# Patient Record
Sex: Male | Born: 1990 | Hispanic: Yes | Marital: Single | Smoking: Never smoker
Health system: Southern US, Community
[De-identification: ages and names within clinical notes are randomized; demographics above are authoritative.]

---

## 2013-09-20 ENCOUNTER — Ambulatory Visit: Payer: BC Managed Care – PPO

## 2013-09-20 ENCOUNTER — Ambulatory Visit: Payer: BC Managed Care – PPO | Admitting: Emergency Medicine

## 2013-09-20 ENCOUNTER — Ambulatory Visit (HOSPITAL_COMMUNITY)
Admission: RE | Admit: 2013-09-20 | Discharge: 2013-09-20 | Disposition: A | Discharge status: Home / Self Care | Payer: BC Managed Care – PPO | Source: Ambulatory Visit | Attending: Emergency Medicine | Admitting: Emergency Medicine

## 2013-09-20 VITALS — BP 120/70 | HR 71 | Temp 98.2°F | Resp 16 | Ht 66.5 in | Wt 169.0 lb

## 2013-09-20 DIAGNOSIS — S139XXA Sprain of joints and ligaments of unspecified parts of neck, initial encounter: Secondary | ICD-10-CM

## 2013-09-20 DIAGNOSIS — R410 Disorientation, unspecified: Secondary | ICD-10-CM

## 2013-09-20 DIAGNOSIS — F29 Unspecified psychosis not due to a substance or known physiological condition: Secondary | ICD-10-CM | POA: Insufficient documentation

## 2013-09-20 MED ORDER — NAPROXEN SODIUM 550 MG PO TABS: 550.0000 mg | ORAL_TABLET | Freq: Two times a day (BID) | ORAL | Status: AC

## 2013-09-20 MED ORDER — CYCLOBENZAPRINE HCL 10 MG PO TABS: 10.0000 mg | ORAL_TABLET | Freq: Three times a day (TID) | ORAL | Status: AC | PRN

## 2013-09-20 MED ORDER — ACETAMINOPHEN-CODEINE #3 300-30 MG PO TABS: 1.0000 | ORAL_TABLET | ORAL | Status: AC | PRN

## 2013-09-20 NOTE — Patient Instructions (Signed)
Cervical Sprain A cervical sprain is an injury in the neck in which the strong, fibrous tissues (ligaments) that connect your neck bones stretch or tear. Cervical sprains can range from mild to severe. Severe cervical sprains can cause the neck vertebrae to be unstable. This can lead to damage of the spinal cord and can result in serious nervous system problems. The amount of time it takes for a cervical sprain to get better depends on the cause and extent of the injury. Most cervical sprains heal in 1 to 3 weeks. CAUSES  Severe cervical sprains may be caused by:   Contact sport injuries (such as from football, rugby, wrestling, hockey, auto racing, gymnastics, diving, martial arts, or boxing).   Motor vehicle collisions.   Whiplash injuries. This is an injury from a sudden forward-and backward whipping movement of the head and neck.  Falls.  Mild cervical sprains may be caused by:   Being in an awkward position, such as while cradling a telephone between your ear and shoulder.   Sitting in a chair that does not offer proper support.   Working at a poorly designed computer station.   Looking up or down for long periods of time.  SYMPTOMS   Pain, soreness, stiffness, or a burning sensation in the front, back, or sides of the neck. This discomfort may develop immediately after the injury or slowly, 24 hours or more after the injury.   Pain or tenderness directly in the middle of the back of the neck.   Shoulder or upper back pain.   Limited ability to move the neck.   Headache.   Dizziness.   Weakness, numbness, or tingling in the hands or arms.   Muscle spasms.   Difficulty swallowing or chewing.   Tenderness and swelling of the neck.  DIAGNOSIS  Most of the time your health care provider can diagnose a cervical sprain by taking your history and doing a physical exam. Your health care provider will ask about previous neck injuries and any known neck  problems, such as arthritis in the neck. X-rays may be taken to find out if there are any other problems, such as with the bones of the neck. Other tests, such as a CT scan or MRI, may also be needed.  TREATMENT  Treatment depends on the severity of the cervical sprain. Mild sprains can be treated with rest, keeping the neck in place (immobilization), and pain medicines. Severe cervical sprains are immediately immobilized. Further treatment is done to help with pain, muscle spasms, and other symptoms and may include:  Medicines, such as pain relievers, numbing medicines, or muscle relaxants.   Physical therapy. This may involve stretching exercises, strengthening exercises, and posture training. Exercises and improved posture can help stabilize the neck, strengthen muscles, and help stop symptoms from returning.  HOME CARE INSTRUCTIONS   Put ice on the injured area.   Put ice in a plastic bag.   Place a towel between your skin and the bag.   Leave the ice on for 15 20 minutes, 3 4 times a day.   If your injury was severe, you may have been given a cervical collar to wear. A cervical collar is a two-piece collar designed to keep your neck from moving while it heals.  Do not remove the collar unless instructed by your health care provider.  If you have long hair, keep it outside of the collar.  Ask your health care provider before making any adjustments to your collar.   Minor adjustments may be required over time to improve comfort and reduce pressure on your chin or on the back of your head.  Ifyou are allowed to remove the collar for cleaning or bathing, follow your health care provider's instructions on how to do so safely.  Keep your collar clean by wiping it with mild soap and water and drying it completely. If the collar you have been given includes removable pads, remove them every 1 2 days and hand wash them with soap and water. Allow them to air dry. They should be completely  dry before you wear them in the collar.  If you are allowed to remove the collar for cleaning and bathing, wash and dry the skin of your neck. Check your skin for irritation or sores. If you see any, tell your health care provider.  Do not drive while wearing the collar.   Only take over-the-counter or prescription medicines for pain, discomfort, or fever as directed by your health care provider.   Keep all follow-up appointments as directed by your health care provider.   Keep all physical therapy appointments as directed by your health care provider.   Make any needed adjustments to your workstation to promote good posture.   Avoid positions and activities that make your symptoms worse.   Warm up and stretch before being active to help prevent problems.  SEEK MEDICAL CARE IF:   Your pain is not controlled with medicine.   You are unable to decrease your pain medicine over time as planned.   Your activity level is not improving as expected.  SEEK IMMEDIATE MEDICAL CARE IF:   You develop any bleeding.  You develop stomach upset.  You have signs of an allergic reaction to your medicine.   Your symptoms get worse.   You develop new, unexplained symptoms.   You have numbness, tingling, weakness, or paralysis in any part of your body.  MAKE SURE YOU:   Understand these instructions.  Will watch your condition.  Will get help right away if you are not doing well or get worse. Document Released: 04/17/2007 Document Revised: 04/10/2013 Document Reviewed: 12/26/2012 Exit[employer] Patient Information 2014 [employer].  
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
## 2013-09-26 ENCOUNTER — Ambulatory Visit: Payer: BC Managed Care – PPO | Admitting: Emergency Medicine

## 2013-09-26 VITALS — BP 122/60 | HR 83 | Temp 97.9°F | Resp 18 | Ht 66.5 in | Wt 175.0 lb

## 2013-09-26 DIAGNOSIS — S139XXA Sprain of joints and ligaments of unspecified parts of neck, initial encounter: Secondary | ICD-10-CM
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
## 2013-09-26 NOTE — Patient Instructions (Signed)
Cervical Sprain A cervical sprain is an injury in the neck in which the strong, fibrous tissues (ligaments) that connect your neck bones stretch or tear. Cervical sprains can range from mild to severe. Severe cervical sprains can cause the neck vertebrae to be unstable. This can lead to damage of the spinal cord and can result in serious nervous system problems. The amount of time it takes for a cervical sprain to get better depends on the cause and extent of the injury. Most cervical sprains heal in 1 to 3 weeks. CAUSES  Severe cervical sprains may be caused by:   Contact sport injuries (such as from football, rugby, wrestling, hockey, auto racing, gymnastics, diving, martial arts, or boxing).   Motor vehicle collisions.   Whiplash injuries. This is an injury from a sudden forward-and backward whipping movement of the head and neck.  Falls.  Mild cervical sprains may be caused by:   Being in an awkward position, such as while cradling a telephone between your ear and shoulder.   Sitting in a chair that does not offer proper support.   Working at a poorly designed computer station.   Looking up or down for long periods of time.  SYMPTOMS   Pain, soreness, stiffness, or a burning sensation in the front, back, or sides of the neck. This discomfort may develop immediately after the injury or slowly, 24 hours or more after the injury.   Pain or tenderness directly in the middle of the back of the neck.   Shoulder or upper back pain.   Limited ability to move the neck.   Headache.   Dizziness.   Weakness, numbness, or tingling in the hands or arms.   Muscle spasms.   Difficulty swallowing or chewing.   Tenderness and swelling of the neck.  DIAGNOSIS  Most of the time your health care provider can diagnose a cervical sprain by taking your history and doing a physical exam. Your health care provider will ask about previous neck injuries and any known neck  problems, such as arthritis in the neck. X-rays may be taken to find out if there are any other problems, such as with the bones of the neck. Other tests, such as a CT scan or MRI, may also be needed.  TREATMENT  Treatment depends on the severity of the cervical sprain. Mild sprains can be treated with rest, keeping the neck in place (immobilization), and pain medicines. Severe cervical sprains are immediately immobilized. Further treatment is done to help with pain, muscle spasms, and other symptoms and may include:  Medicines, such as pain relievers, numbing medicines, or muscle relaxants.   Physical therapy. This may involve stretching exercises, strengthening exercises, and posture training. Exercises and improved posture can help stabilize the neck, strengthen muscles, and help stop symptoms from returning.  HOME CARE INSTRUCTIONS   Put ice on the injured area.   Put ice in a plastic bag.   Place a towel between your skin and the bag.   Leave the ice on for 15 20 minutes, 3 4 times a day.   If your injury was severe, you may have been given a cervical collar to wear. A cervical collar is a two-piece collar designed to keep your neck from moving while it heals.  Do not remove the collar unless instructed by your health care provider.  If you have long hair, keep it outside of the collar.  Ask your health care provider before making any adjustments to your collar.   Minor adjustments may be required over time to improve comfort and reduce pressure on your chin or on the back of your head.  Ifyou are allowed to remove the collar for cleaning or bathing, follow your health care provider's instructions on how to do so safely.  Keep your collar clean by wiping it with mild soap and water and drying it completely. If the collar you have been given includes removable pads, remove them every 1 2 days and hand wash them with soap and water. Allow them to air dry. They should be completely  dry before you wear them in the collar.  If you are allowed to remove the collar for cleaning and bathing, wash and dry the skin of your neck. Check your skin for irritation or sores. If you see any, tell your health care provider.  Do not drive while wearing the collar.   Only take over-the-counter or prescription medicines for pain, discomfort, or fever as directed by your health care provider.   Keep all follow-up appointments as directed by your health care provider.   Keep all physical therapy appointments as directed by your health care provider.   Make any needed adjustments to your workstation to promote good posture.   Avoid positions and activities that make your symptoms worse.   Warm up and stretch before being active to help prevent problems.  SEEK MEDICAL CARE IF:   Your pain is not controlled with medicine.   You are unable to decrease your pain medicine over time as planned.   Your activity level is not improving as expected.  SEEK IMMEDIATE MEDICAL CARE IF:   You develop any bleeding.  You develop stomach upset.  You have signs of an allergic reaction to your medicine.   Your symptoms get worse.   You develop new, unexplained symptoms.   You have numbness, tingling, weakness, or paralysis in any part of your body.  MAKE SURE YOU:   Understand these instructions.  Will watch your condition.  Will get help right away if you are not doing well or get worse. Document Released: 04/17/2007 Document Revised: 04/10/2013 Document Reviewed: 12/26/2012 Exit[employer] Patient Information 2014 [employer].  

## 2017-07-03 ENCOUNTER — Other Ambulatory Visit: Payer: Self-pay

## 2017-07-03 ENCOUNTER — Encounter: Payer: Self-pay | Admitting: Physician Assistant

## 2017-07-03 ENCOUNTER — Ambulatory Visit: Payer: Self-pay | Admitting: Physician Assistant

## 2017-07-03 VITALS — BP 128/82 | HR 90 | Temp 98.1°F | Resp 16 | Ht 66.5 in | Wt 196.0 lb

## 2017-07-03 DIAGNOSIS — R0789 Other chest pain: Secondary | ICD-10-CM

## 2017-07-03 DIAGNOSIS — K219 Gastro-esophageal reflux disease without esophagitis: Secondary | ICD-10-CM

## 2017-07-03 LAB — POCT CBC
Granulocyte percent: 65.6 %G (ref 37–80)
HCT, POC: 46.3 % (ref 43.5–53.7)
Hemoglobin: 15.4 g/dL (ref 14.1–18.1)
LYMPH, POC: 2 (ref 0.6–3.4)
MCH, POC: 29.4 pg (ref 27–31.2)
MCHC: 33.2 g/dL (ref 31.8–35.4)
MCV: 88.4 fL (ref 80–97)
MID (CBC): 0.3 (ref 0–0.9)
MPV: 6.8 fL (ref 0–99.8)
POC Granulocyte: 4.5 (ref 2–6.9)
POC LYMPH PERCENT: 29.6 %L (ref 10–50)
POC MID %: 4.8 %M (ref 0–12)
Platelet Count, POC: 319 10*3/uL (ref 142–424)
RBC: 5.23 M/uL (ref 4.69–6.13)
RDW, POC: 12.3 %
WBC: 6.9 10*3/uL (ref 4.6–10.2)

## 2017-07-03 MED ORDER — OMEPRAZOLE 40 MG PO CPDR: 40.0000 mg | DELAYED_RELEASE_CAPSULE | Freq: Every day | ORAL | 1 refills | Status: AC

## 2017-07-03 NOTE — Progress Notes (Signed)
[facility] [address], [place] [phone]  Date:  07/03/2017   Name:  Jonell CluckJuan Jose Garcia-Salazar   DOB:  [birth year]   MRN:  [id number]  PCP:  Patient, No Pcp Per    History of Present Illness:  Jonell CluckJuan Jose Garcia-Salazar is a 26 y.o. male patient who presents to PCP with  Chief Complaint  Patient presents with  . Shortness of Breath    x 1 month  . Chest Pain    x 1 month, pt states he feels a burning sensation after eating, possible acid reflux     2 months, feels like he can not get enough of oxygen. He will have chest pains 2 days ago, at the center of chest which appeared to pressure.  He tries to inhale very deeply, will have chest pain.  The chest pain is imnmediately after a meal at times.  He was breathing chemical vapors at his work, his breathing was off.  Works at a [employer].   There is no nausea associated, or palpitations,  Dizziness, diaphoresis, or vision changes.   No history of asthma.   Non-smoker.   No family history of heart attack,  He is eating mostly fastfood, sodas 3 per week,  EtOH 5 beers   There are no active problems to display for this patient.  Wt Readings from Last 3 Encounters:  07/03/17 196 lb (88.9 kg)  09/26/13 175 lb (79.4 kg)  09/20/13 169 lb (76.7 kg)     No past medical history on file.  No past surgical history on file.  Social History   Tobacco Use  . Smoking status: Never Smoker  Substance Use Topics  . Alcohol use: Not on file  . Drug use: Not on file    No family history on file.  No Known Allergies  Medication list has been reviewed and updated.  Current Outpatient Medications on File Prior to Visit  Medication Sig Dispense Refill  . acetaminophen-codeine (TYLENOL #3) 300-30 MG per tablet Take 1-2 tablets by mouth every 4 (four) hours as needed. (Patient not taking: Reported on 07/03/2017) 30 tablet 0  . cyclobenzaprine (FLEXERIL) 10 MG tablet Take 1 tablet (10 mg total) by  mouth 3 (three) times daily as needed for muscle spasms. (Patient not taking: Reported on 07/03/2017) 30 tablet 0   No current facility-administered medications on file prior to visit.     ROS ROS otherwise unremarkable unless listed above.  Physical Examination: BP (!) 148/85   Pulse 90   Temp 98.1 F (36.7 C) (Oral)   Resp 16   Ht 5' 6.5" (1.689 m)   Wt 196 lb (88.9 kg)   SpO2 97%   BMI 31.16 kg/m  Ideal Body Weight: Weight in (lb) to have BMI = 25: 156.9  Physical Exam  Constitutional: He is oriented to person, place, and time. He appears well-developed and well-nourished. No distress.  HENT:  Head: Normocephalic and atraumatic.  Eyes: Conjunctivae and EOM are normal. Pupils are equal, round, and reactive to light.  Cardiovascular: Normal rate.  Pulmonary/Chest: Effort normal and breath sounds normal. No respiratory distress. He has no decreased breath sounds. He has no wheezes.  Neurological: He is alert and oriented to person, place, and time.  Skin: Skin is warm and dry. He is not diaphoretic.  Psychiatric: He has a normal mood and affect. His behavior is normal.    Results for orders placed or performed in visit on 07/03/17  POCT CBC  Result Value Ref Range   WBC 6.9 4.6 - 10.2 K/uL   Lymph, poc 2.0 0.6 - 3.4   POC LYMPH PERCENT 29.6 10 - 50 %L   MID (cbc) 0.3 0 - 0.9   POC MID % 4.8 0 - 12 %M   POC Granulocyte 4.5 2 - 6.9   Granulocyte percent 65.6 37 - 80 %G   RBC 5.23 4.69 - 6.13 M/uL   Hemoglobin 15.4 14.1 - 18.1 g/dL   HCT, POC 19.146.3 47.843.5 - 53.7 %   MCV 88.4 80 - 97 fL   MCH, POC 29.4 27 - 31.2 pg   MCHC 33.2 31.8 - 35.4 g/dL   RDW, POC 29.512.3 %   Platelet Count, POC 319 142 - 424 K/uL   MPV 6.8 0 - 99.8 fL     Assessment and Plan: Jonell CluckJuan Jose Garcia-Salazar is a 26 y.o. male who is here today for cc of  Chief Complaint  Patient presents with  . Shortness of Breath    x 1 month  . Chest Pain    x 1 month, pt states he feels a burning sensation  after eating, possible acid reflux   This is likely reflux.  I am restarting omeprazole for the next 2 weeks consistently.  Advised gerd diet.  If he does not improve his symptoms, we will perform chest xray. He declines an xray at this time. Gastroesophageal reflux disease without esophagitis - Plan: omeprazole (PRILOSEC) 40 MG capsule  Other chest pain - Plan: EKG 12-Lead, POCT CBC, omeprazole (PRILOSEC) 40 MG capsule  Trena PlattStephanie Page Pucciarelli, PA-C [facility] 1/4/20199:37 AM

## 2017-07-03 NOTE — Patient Instructions (Addendum)
Please take this for at least 2 weeks.   There are some other lifestyle recommendations beside diet to review below.    Food Choices for Gastroesophageal Reflux Disease, Adult When you have gastroesophageal reflux disease (GERD), the foods you eat and your eating habits are very important. Choosing the right foods can help ease your discomfort. What guidelines do I need to follow?  Choose fruits, vegetables, whole grains, and low-fat dairy products.  Choose low-fat meat, fish, and poultry.  Limit fats such as oils, salad dressings, butter, nuts, and avocado.  Keep a food diary. This helps you identify foods that cause symptoms.  Avoid foods that cause symptoms. These may be different for everyone.  Eat small meals often instead of 3 large meals a day.  Eat your meals slowly, in a place where you are relaxed.  Limit fried foods.  Cook foods using methods other than frying.  Avoid drinking alcohol.  Avoid drinking large amounts of liquids with your meals.  Avoid bending over or lying down until 2-3 hours after eating. What foods are not recommended? These are some foods and drinks that may make your symptoms worse: Vegetables Tomatoes. Tomato juice. Tomato and spaghetti sauce. Chili peppers. Onion and garlic. Horseradish. Fruits Oranges, grapefruit, and lemon (fruit and juice). Meats High-fat meats, fish, and poultry. This includes hot dogs, ribs, ham, sausage, salami, and bacon. Dairy Whole milk and chocolate milk. Sour cream. Cream. Butter. Ice cream. Cream cheese. Drinks Coffee and tea. Bubbly (carbonated) drinks or energy drinks. Condiments Hot sauce. Barbecue sauce. Sweets/Desserts Chocolate and cocoa. Donuts. Peppermint and spearmint. Fats and Oils High-fat foods. This includes [place] fries and potato chips. Other Vinegar. Strong spices. This includes black pepper, white pepper, red pepper, cayenne, curry powder, cloves, ginger, and chili powder. The items listed  above may not be a complete list of foods and drinks to avoid. Contact your dietitian for more information. This information is not intended to replace advice given to you by your health care provider. Make sure you discuss any questions you have with your health care provider. Document Released: 12/20/2011 Document Revised: 11/26/2015 Document Reviewed: 04/24/2013 Elsevier Interactive Patient Education  2017 [employer]    IF you received an x-ray today, you will receive an invoice from [facility]. Please contact [facility] at [phone] with questions or concerns regarding your invoice.   IF you received labwork today, you will receive an invoice from [facility]. Please contact [facility] at [phone] with questions or concerns regarding your invoice.   Our billing staff will not be able to assist you with questions regarding bills from these companies.  You will be contacted with the lab results as soon as they are available. The fastest way to get your results is to activate your My Chart account. Instructions are located on the last page of this paperwork. If you have not heard from us regarding the results in 2 weeks, please contact this office.

## 2017-07-07 ENCOUNTER — Encounter: Payer: Self-pay | Admitting: Physician Assistant

## 2017-09-23 ENCOUNTER — Ambulatory Visit (INDEPENDENT_AMBULATORY_CARE_PROVIDER_SITE_OTHER): Payer: Self-pay | Admitting: Physician Assistant

## 2017-09-23 ENCOUNTER — Other Ambulatory Visit: Payer: Self-pay

## 2017-09-23 ENCOUNTER — Ambulatory Visit (INDEPENDENT_AMBULATORY_CARE_PROVIDER_SITE_OTHER): Payer: Self-pay

## 2017-09-23 ENCOUNTER — Encounter: Payer: Self-pay | Admitting: Physician Assistant

## 2017-09-23 VITALS — BP 126/79 | HR 115 | Temp 98.7°F | Ht 66.5 in | Wt 189.4 lb

## 2017-09-23 DIAGNOSIS — K117 Disturbances of salivary secretion: Secondary | ICD-10-CM

## 2017-09-23 DIAGNOSIS — R0989 Other specified symptoms and signs involving the circulatory and respiratory systems: Secondary | ICD-10-CM

## 2017-09-23 MED ORDER — IPRATROPIUM BROMIDE 0.03 % NA SOLN: 2.0000 | Freq: Two times a day (BID) | NASAL | 0 refills | Status: AC

## 2017-09-23 NOTE — Progress Notes (Signed)
PRIMARY CARE AT [facility] [address], [place] [address] [phone]  Date:  09/23/2017   Name:  Paul Murray   DOB:  07-24-1990   MRN:  [id number]  PCP:  Garnetta Buddy, PA    History of Present Illness:  Paul Murray is a 27 y.o. male patient who presents to PCP with  Chief Complaint  Patient presents with  . Nasal Congestion    feels as thogh there is some throat restriction due to all the mucus thats in his throat. Has been going on for a while     Feels like he is swallowing a lot of mucus in his throat.  It feels like it is accumulating in his throat.  This is throughout the day.  He is not snoring or waking gasping for air.  No sore throat.  He is having some stuffiness occasionally.  No sneezing.  No history of seasonal allergies or triggers.  He denies any cough.  No night sweats.  No urinary frequency.  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  He is concerned that he may be taking any toxins.  He has some thirst.   Possible diabetes in family with father and pgf Mother has thyroid disease.  Mother with thyroidectomy and sister.   And cousin.   He is eating some sugary foods.  Not sodas.  He hydrates well with only water.    Wt Readings from Last 3 Encounters:  09/23/17 189 lb 6.4 oz (85.9 kg)  07/03/17 196 lb (88.9 kg)  09/26/13 175 lb (79.4 kg)     There are no active problems to display for this patient.   History reviewed. No pertinent past medical history.  History reviewed. No pertinent surgical history.  Social History   Tobacco Use  . Smoking status: Never Smoker  . Smokeless tobacco: Never Used  Substance Use Topics  . Alcohol use: Not on file  . Drug use: Not on file    History reviewed. No pertinent family history.  No Known Allergies  Medication list has been reviewed and updated.  Current Outpatient Medications on File Prior to Visit  Medication Sig Dispense Refill  .  acetaminophen-codeine (TYLENOL #3) 300-30 MG per tablet Take 1-2 tablets by mouth every 4 (four) hours as needed. (Patient not taking: Reported on 07/03/2017) 30 tablet 0  . cyclobenzaprine (FLEXERIL) 10 MG tablet Take 1 tablet (10 mg total) by mouth 3 (three) times daily as needed for muscle spasms. (Patient not taking: Reported on 07/03/2017) 30 tablet 0  . omeprazole (PRILOSEC) 40 MG capsule Take 1 capsule (40 mg total) by mouth daily. 30 capsule 1   No current facility-administered medications on file prior to visit.     ROS ROS otherwise unremarkable unless listed above.  Physical Examination: BP 126/79   Pulse (!) 115   Temp 98.7 F (37.1 C) (Oral)   Ht 5' 6.5" (1.689 m)   Wt 189 lb 6.4 oz (85.9 kg)   SpO2 98%   BMI 30.11 kg/m  Ideal Body Weight: Weight in (lb) to have BMI = 25: 156.9  Physical Exam  Constitutional: He is oriented to person, place, and time. He appears well-developed and well-nourished. No distress.  HENT:  Head: Atraumatic.  Right Ear: Tympanic membrane, external ear and ear canal normal.  Left Ear: Tympanic membrane, external ear and ear canal normal.  Nose: Mucosal edema and rhinorrhea present. Right sinus exhibits no maxillary sinus tenderness and  no frontal sinus tenderness. Left sinus exhibits no maxillary sinus tenderness and no frontal sinus tenderness.  Mouth/Throat: No uvula swelling. No oropharyngeal exudate, posterior oropharyngeal edema or posterior oropharyngeal erythema.  Eyes: Pupils are equal, round, and reactive to light. Conjunctivae, EOM and lids are normal. Right eye exhibits normal extraocular motion. Left eye exhibits normal extraocular motion.  Neck: Trachea normal and full passive range of motion without pain. No edema and no erythema present.  Cardiovascular: Normal rate, regular rhythm, normal heart sounds and intact distal pulses. Exam reveals no friction rub.  No murmur heard. Pulmonary/Chest: Effort normal. No respiratory  distress. He has no decreased breath sounds. He has no wheezes. He has no rhonchi.  Neurological: He is alert and oriented to person, place, and time.  Skin: Skin is warm and dry. He is not diaphoretic.  Psychiatric: He has a normal mood and affect. His behavior is normal.     Dg Chest 2 View  Result Date: 09/23/2017 CLINICAL DATA:  Increased saliva. EXAM: CHEST - 2 VIEW COMPARISON:  None. FINDINGS: The heart size and mediastinal contours are within normal limits. Both lungs are clear. The visualized skeletal structures are unremarkable. IMPRESSION: No active cardiopulmonary disease. Electronically Signed   By: Gerome Samavid  Williams III M.D   On: 09/23/2017 11:19    Assessment and Plan: Paul Murray is a 27 y.o. male who is here today for cc of  Chief Complaint  Patient presents with  . Nasal Congestion    feels as thogh there is some throat restriction due to all the mucus thats in his throat. Has been going on for a while  --patient reports he is hung over.  That may explain his pulse.  I have advised him how to take his pulse and to return if it remains above 100.  He voiced understanding.  I also advised that given his symptoms we could get a thyroid test.  He has declined to this and just wants a chest x-ray.  Saliva increased - Plan: ipratropium (ATROVENT) 0.03 % nasal spray, DG Chest 2 View, CANCELED: POCT CBC, CANCELED: POCT glycosylated hemoglobin (Hb A1C), CANCELED: TSH  Phlegm in throat - Plan: CANCELED: POCT rapid strep A, CANCELED: Culture, Group A Strep  Paul PlattStephanie English, PA-C [facility] 3/29/201912:40 PM

## 2017-09-23 NOTE — Patient Instructions (Addendum)
Both lungs are clear appear clear.  This was read by a radiologist.  The more defining imaging would be cat scan, next.  There were no nodules or concern reported in the lungs at this time.   You can try the ipratropium spray.   Make sure you are hydrating well with 64 oz or more of water per day.  Also avoiding etoH intake, limiting to 2 drinks in one day. Cut out the sweets, and we can see how this is going.   If you continue to have the symptoms, we will obtain the blood work. Please check your pulse at home, after you have hydrated well.  This should be under 100.  If it is not, please return. You should also get your thyroid function at some point.    How to Take a Pulse Your pulse is the increase in pressure inside the blood vessels that carry blood from your heart to the rest of your body (arteries). Every time your heart beats, you can feel your pulse in an artery near the surface of your skin. You can easily feel your pulse in the artery in your wrist (radial artery) and in the artery in your neck (carotid artery). Taking your pulse can tell you how fast your heart is beating and whether it has a normal rhythm. You can also tell whether your heart is beating strongly or weakly. What you need to know about pulse rates Your pulse is the same as your heart rate. Both are measured in beats per minute (bpm). A normal resting heart rate varies depending on a person's age.  Infants under 1 year of age: Normal heart rate of 100-160 bpm.  Children 39-55 years of age: Normal heart rate of 90-150 bpm.  Children 9-58 years of age: Normal heart rate of 80-140 bpm.  Children 74-82 years of age: Normal heart rate of 70-120 bpm.  Everyone over 28 years of age: Normal heart rate of 60-100 bpm.  There can be a lot of variation in your pulse. It can be different depending on the time of day or the amount of exercise that you get. It changes with your fitness level. Many things can change the speed and  regularity of your pulse. These include:  Exercise.  Fever.  Stress.  Heart problems.  Poor circulation.  Medicines.  How to take your pulse To take your pulse, all you need is a digital stopwatch or a clock or watch that has a second hand. The best time to measure your resting pulse is in the morning before you start moving around. Take it as soon as you wake up or after resting for about 10 minutes. There are no firm rules about how often to check your pulse. In general, it is a good idea to check your pulse at least once a month. Measuring your pulse is a good way to check your heart health. Checking your pulse before and after exercise can tell you if you are getting the right amount of exercise. This is called finding your target heart rate. Your target heart rate depends on your age, fitness, and health. Ask your health care provider what would be a safe target heart rate for you during exercise. Radial Pulse To check the pulse in your radial artery: 1. Turn one hand palm-up and relax your arm. 2. Place the first two fingers of your other hand gently over your wrist, just below the base of your thumb. 3. Place your fingertips  just inside the bone that runs along the outside of your arm. 4. Slowly increase pressure until you feel a pulsing beneath your fingers. You may need to move your fingers slightly. 5. Do not press too hard. Too much pressure may cut off blood supply. 6. Count how many pulse beats you feel in 1 minute. Or, count how many pulse beats you feel in 30 seconds and double that number. 7. Pay attention to the rhythm of the pulse. It should be steady and even.  Carotid Pulse To check the pulse in your carotid artery: 1. Place two fingers just to one side of your Adam's apple so that you feel a pulsing beneath your fingers. 2. Do not press too hard. Too much pressure may cut off blood supply and can make you dizzy. 3. Count how many pulse beats you feel in 1 minute.  Or, count how many pulse beats you feel in 30 seconds and double that number. 4. Pay attention to the rhythm of the pulse. It should be steady and even.  Contact a health care provider if:  Your pulse is too slow or too fast.  Your pulse is weak or hard to find.  You have skipped beats or extra beats.  Your pulse has an irregular rhythm.  You have an abnormal pulse along with dizziness, fatigue, or shortness of breath. This information is not intended to replace advice given to you by your health care provider. Make sure you discuss any questions you have with your health care provider. Document Released: 12/25/2002 Document Revised: 01/08/2016 Document Reviewed: 11/24/2015 Elsevier Interactive Patient Education  2018 [employer]   IF you received an x-ray today, you will receive an invoice from [facility]. Please contact [facility] at [phone] with questions or concerns regarding your invoice.   IF you received labwork today, you will receive an invoice from [facility]. Please contact [facility] at [phone] with questions or concerns regarding your invoice.   Our billing staff will not be able to assist you with questions regarding bills from these companies.  You will be contacted with the lab results as soon as they are available. The fastest way to get your results is to activate your My Chart account. Instructions are located on the last page of this paperwork. If you have not heard from us regarding the results in 2 weeks, please contact this office.

## 2017-09-25 LAB — CULTURE, GROUP A STREP: STREP A CULTURE: NEGATIVE

## 2017-10-04 ENCOUNTER — Encounter: Payer: Self-pay | Admitting: Physician Assistant

## 2017-10-06 ENCOUNTER — Other Ambulatory Visit: Payer: Self-pay | Admitting: Internal Medicine

## 2017-10-06 DIAGNOSIS — R0602 Shortness of breath: Secondary | ICD-10-CM

## 2017-10-09 ENCOUNTER — Ambulatory Visit (INDEPENDENT_AMBULATORY_CARE_PROVIDER_SITE_OTHER): Payer: Self-pay | Admitting: Internal Medicine

## 2017-10-09 DIAGNOSIS — R0602 Shortness of breath: Secondary | ICD-10-CM

## 2017-10-09 LAB — PULMONARY FUNCTION TEST
DL/VA % PRED: 142 %
DL/VA: 6.32 ml/min/mmHg/L
DLCO UNC % PRED: 140 %
DLCO unc: 37.78 ml/min/mmHg
FEF 25-75 POST: 5.62 L/s
FEF 25-75 PRE: 4.93 L/s
FEF2575-%Change-Post: 14 %
FEF2575-%PRED-POST: 130 %
FEF2575-%Pred-Pre: 114 %
FEV1-%Change-Post: 3 %
FEV1-%PRED-PRE: 89 %
FEV1-%Pred-Post: 92 %
FEV1-POST: 3.76 L
FEV1-Pre: 3.63 L
FEV1FVC-%Change-Post: 2 %
FEV1FVC-%PRED-PRE: 105 %
FEV6-%CHANGE-POST: 1 %
FEV6-%PRED-POST: 86 %
FEV6-%Pred-Pre: 85 %
FEV6-POST: 4.2 L
FEV6-Pre: 4.15 L
FEV6FVC-%CHANGE-POST: 0 %
FEV6FVC-%PRED-POST: 100 %
FEV6FVC-%Pred-Pre: 100 %
FVC-%Change-Post: 1 %
FVC-%PRED-POST: 86 %
FVC-%Pred-Pre: 85 %
FVC-Post: 4.2 L
FVC-Pre: 4.16 L
POST FEV1/FVC RATIO: 89 %
Post FEV6/FVC ratio: 100 %
Pre FEV1/FVC ratio: 87 %
Pre FEV6/FVC Ratio: 100 %
RV % PRED: 67 %
RV: 0.89 L
TLC % PRED: 89 %
TLC: 5.44 L

## 2017-10-09 NOTE — Progress Notes (Signed)
PFT completed today 10/09/17

## 2017-10-18 ENCOUNTER — Telehealth: Payer: Self-pay | Admitting: Internal Medicine

## 2017-10-18 NOTE — Telephone Encounter (Signed)
Called and spoke with patient he is aware nothing further needed.

## 2017-10-18 NOTE — Telephone Encounter (Signed)
He needs to get these results from the doctor who actually ordered his study- I have not seen him.

## 2017-10-18 NOTE — Telephone Encounter (Signed)
Called patient unable to reach left message to give us a call back.

## 2017-10-18 NOTE — Telephone Encounter (Signed)
Patient called back I advised him that I would send a message to Dr. Maple HudsonYoung so we could get the results.    CY please advise on patients PFT results. Thank you.

## 2017-10-18 NOTE — Telephone Encounter (Signed)
Patient returning call.

## 2017-10-19 ENCOUNTER — Telehealth: Payer: Self-pay | Admitting: Internal Medicine

## 2017-10-19 NOTE — Telephone Encounter (Signed)
Spoke with Angelia. I advised her I would fax the PFT to her since she was the doctor who ordered the test. Nothing further is needed.

## 2019-03-09 IMAGING — DX DG CHEST 2V
2 series · 2 of 2 positions shown · non-contrast
Comparison: None.

CLINICAL DATA: Increased saliva.

EXAM:
CHEST - 2 VIEW

[chest pa]
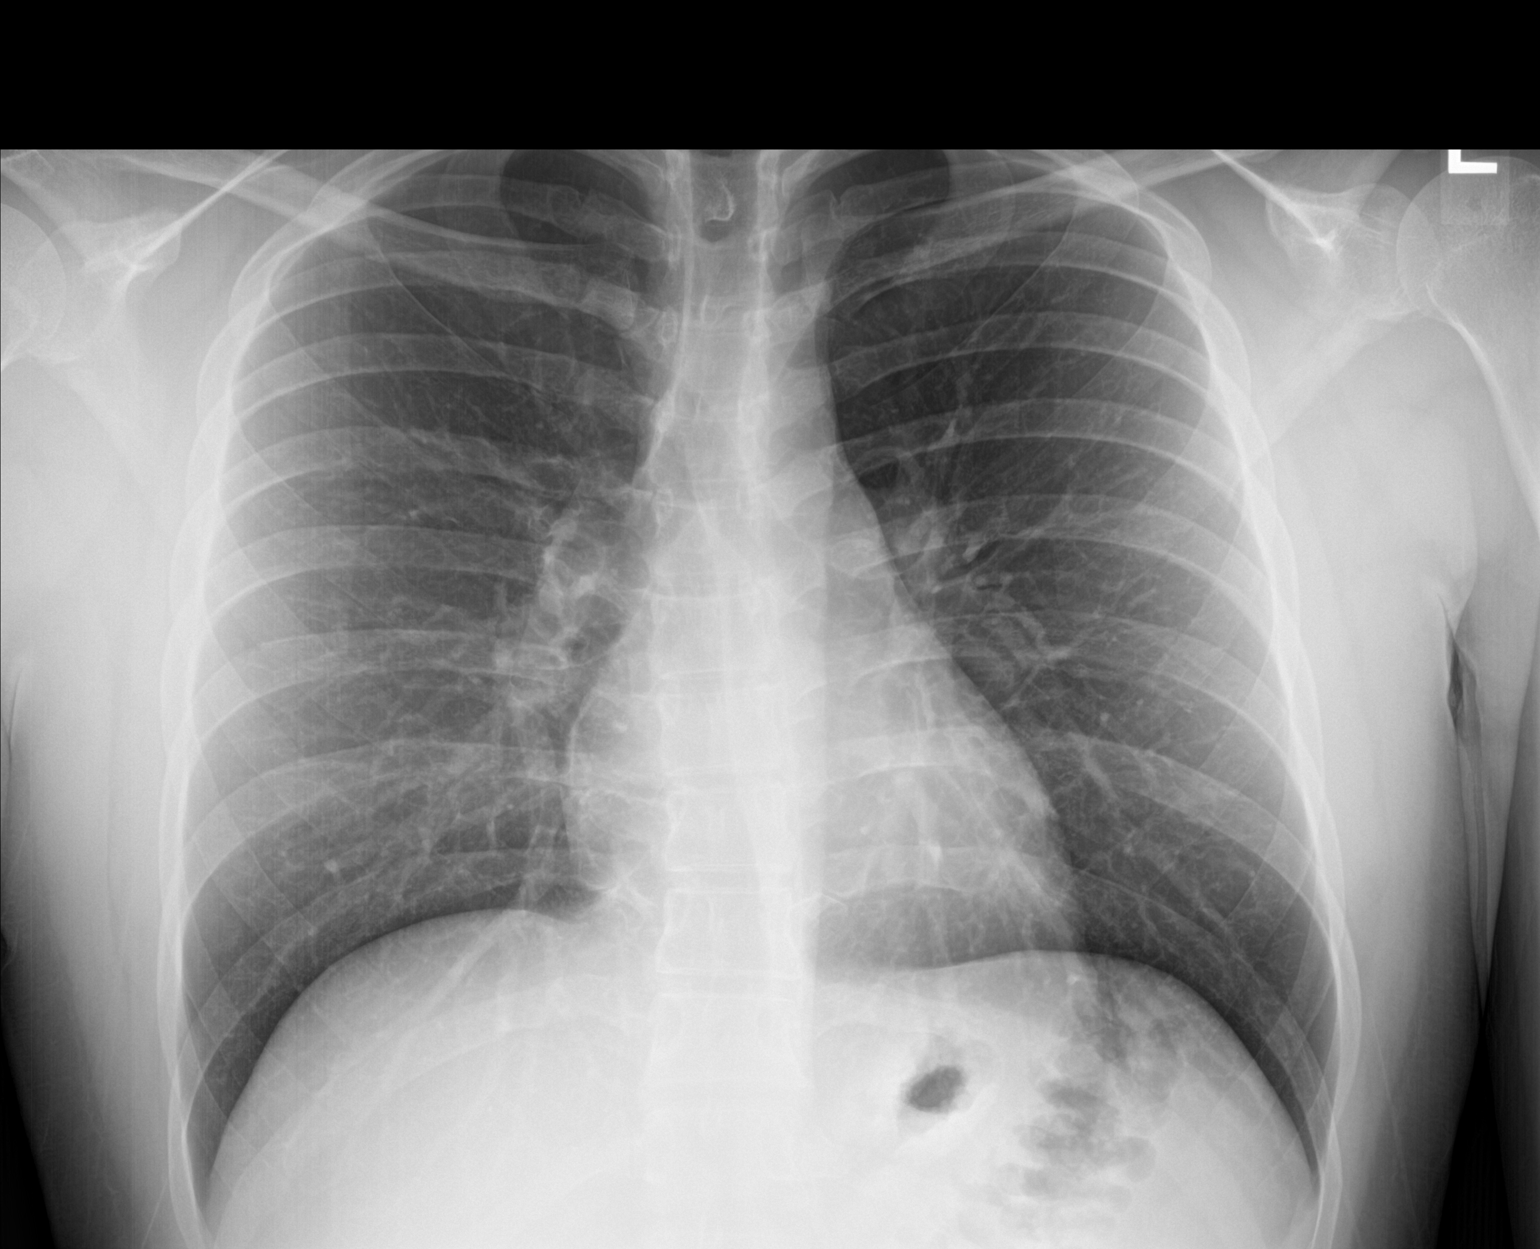

[chest lat]
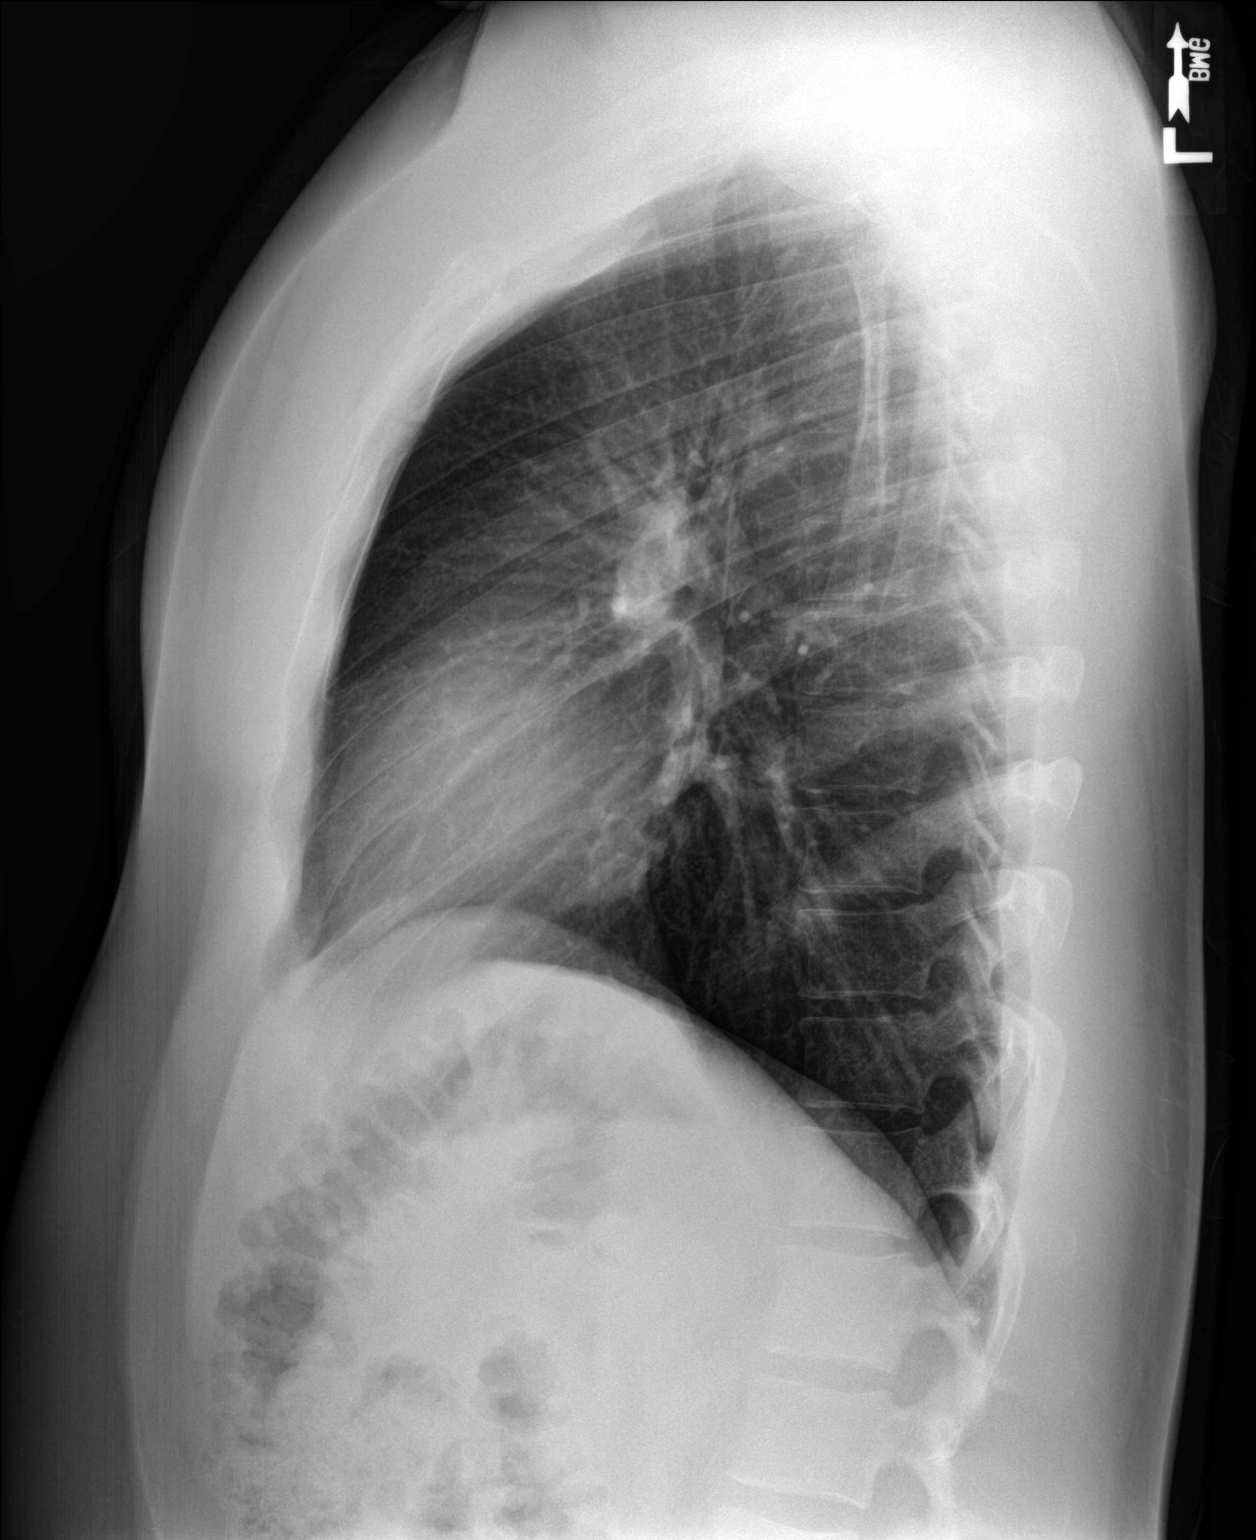

[2 of 2 positions shown; findings below may reference images not displayed]

FINDINGS: The heart size and mediastinal contours are within normal limits.
Both lungs are clear. The visualized skeletal structures are
unremarkable.
IMPRESSION: No active cardiopulmonary disease.

## 2019-06-04 ENCOUNTER — Other Ambulatory Visit: Payer: Self-pay

## 2019-06-04 DIAGNOSIS — Z20822 Contact with and (suspected) exposure to covid-19: Secondary | ICD-10-CM

## 2019-06-05 LAB — NOVEL CORONAVIRUS, NAA: SARS-CoV-2, NAA: NOT DETECTED

## 2019-06-07 ENCOUNTER — Telehealth: Payer: Self-pay | Admitting: *Deleted

## 2019-06-07 NOTE — Telephone Encounter (Signed)
Patient called and given negative covid results . 

## 2019-07-10 ENCOUNTER — Ambulatory Visit: Payer: Self-pay | Attending: Internal Medicine

## 2019-07-10 DIAGNOSIS — Z20822 Contact with and (suspected) exposure to covid-19: Secondary | ICD-10-CM

## 2019-07-10 DIAGNOSIS — U071 COVID-19: Secondary | ICD-10-CM | POA: Insufficient documentation

## 2019-07-12 LAB — NOVEL CORONAVIRUS, NAA: SARS-CoV-2, NAA: DETECTED — AB
